# Patient Record
Sex: Male | Born: 1995 | Race: Black or African American | Hispanic: No | Marital: Single | Smoking: Former smoker
Health system: Southern US, Community
[De-identification: ages and names within clinical notes are randomized; demographics above are authoritative.]

## PROBLEM LIST (undated history)

## (undated) DIAGNOSIS — J45909 Unspecified asthma, uncomplicated: Secondary | ICD-10-CM

---
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
## 2016-07-20 ENCOUNTER — Emergency Department (HOSPITAL_COMMUNITY)
Admission: EM | Admit: 2016-07-20 | Discharge: 2016-07-20 | Disposition: A | Discharge status: Home / Self Care | Payer: No Typology Code available for payment source | Attending: Emergency Medicine | Admitting: Emergency Medicine

## 2016-07-20 ENCOUNTER — Encounter (HOSPITAL_COMMUNITY): Payer: Self-pay | Admitting: Emergency Medicine

## 2016-07-20 DIAGNOSIS — Z5321 Procedure and treatment not carried out due to patient leaving prior to being seen by health care provider: Secondary | ICD-10-CM | POA: Insufficient documentation

## 2016-07-20 DIAGNOSIS — Z4800 Encounter for change or removal of nonsurgical wound dressing: Secondary | ICD-10-CM | POA: Insufficient documentation

## 2016-07-20 NOTE — ED Triage Notes (Signed)
Pt states he cut his left middle finger at work 2 days ago and has been cleaning it with hydrogen peroxide and covering it with guaze then soaking the guaze with peroxide  Pt has white patches noted on his left middle finger and on his other fingers  Pt states the white patches started today

## 2016-07-20 NOTE — ED Notes (Signed)
Pt stated he thinks he over exaggerated and did not want to be seen by a doctor.  Left without being seen.

## 2020-02-28 ENCOUNTER — Emergency Department (HOSPITAL_COMMUNITY)
Admission: EM | Admit: 2020-02-28 | Discharge: 2020-02-28 | Payer: Medicaid Other | Attending: Emergency Medicine | Admitting: Emergency Medicine

## 2020-02-28 ENCOUNTER — Emergency Department (HOSPITAL_COMMUNITY): Payer: Medicaid Other

## 2020-02-28 ENCOUNTER — Other Ambulatory Visit: Payer: Self-pay

## 2020-02-28 ENCOUNTER — Encounter (HOSPITAL_COMMUNITY): Payer: Self-pay

## 2020-02-28 DIAGNOSIS — M79605 Pain in left leg: Secondary | ICD-10-CM

## 2020-02-28 DIAGNOSIS — Z23 Encounter for immunization: Secondary | ICD-10-CM | POA: Insufficient documentation

## 2020-02-28 DIAGNOSIS — Z87891 Personal history of nicotine dependence: Secondary | ICD-10-CM | POA: Insufficient documentation

## 2020-02-28 DIAGNOSIS — S80812A Abrasion, left lower leg, initial encounter: Secondary | ICD-10-CM

## 2020-02-28 DIAGNOSIS — J45909 Unspecified asthma, uncomplicated: Secondary | ICD-10-CM | POA: Insufficient documentation

## 2020-02-28 DIAGNOSIS — W1789XA Other fall from one level to another, initial encounter: Secondary | ICD-10-CM | POA: Insufficient documentation

## 2020-02-28 DIAGNOSIS — Y9302 Activity, running: Secondary | ICD-10-CM | POA: Insufficient documentation

## 2020-02-28 MED ORDER — IBUPROFEN 800 MG PO TABS
800.0000 mg | ORAL_TABLET | Freq: Once | ORAL | Status: AC
  Administered 2020-02-28: 800 mg via ORAL
  Filled 2020-02-28: qty 1

## 2020-02-28 MED ORDER — TETANUS-DIPHTH-ACELL PERTUSSIS 5-2.5-18.5 LF-MCG/0.5 IM SUSP
0.5000 mL | Freq: Once | INTRAMUSCULAR | Status: AC
  Administered 2020-02-28: 0.5 mL via INTRAMUSCULAR
  Filled 2020-02-28: qty 0.5

## 2020-02-28 NOTE — ED Triage Notes (Signed)
Patient arrived with [employer] due to complaints of left leg pain after falling in the woods.

## 2020-02-28 NOTE — ED Provider Notes (Signed)
[facility]-EMERGENCY DEPT Provider Note   CSN: [id number] Arrival date & time: 02/28/20  0057     History Chief Complaint  Patient presents with  . Leg Pain    Randy Flores is a 24 y.o. male.  The history is provided by the patient. No language interpreter was used.  Leg Pain Location:  Leg Time since incident:  2 hours Injury: yes   Mechanism of injury: fall   Fall:    Height of fall:  From standing while running   Impact surface:  Dirt   Entrapped after fall: no   Leg location:  L lower leg Pain details:    Radiates to:  Does not radiate   Severity:  Mild   Onset quality:  Sudden   Timing:  Constant   Progression:  Unchanged Chronicity:  New Dislocation: no   Tetanus status:  Unknown Prior injury to area:  No Relieved by:  Nothing Worsened by:  Bearing weight Ineffective treatments:  None tried Associated symptoms: no decreased ROM, no muscle weakness and no numbness        Past Medical History:  Diagnosis Date  . Asthma     There are no problems to display for this patient.   History reviewed. No pertinent surgical history.     Family History  Problem Relation Age of Onset  . Diabetes Mother     Social History   Tobacco Use  . Smoking status: Former Games developer  . Smokeless tobacco: Never Used  Substance Use Topics  . Alcohol use: No  . Drug use: No    Home Medications Prior to Admission medications   Not on File    Allergies    Ocuflox [ofloxacin]  Review of Systems   Review of Systems  Ten systems reviewed and are negative for acute change, except as noted in the HPI.    Physical Exam Updated Vital Signs BP 104/64 (BP Location: Left Arm)   Pulse 93   Temp 98.1 F (36.7 C) (Oral)   Resp 18   SpO2 99%   Physical Exam Vitals and nursing note reviewed.  Constitutional:      General: He is not in acute distress.    Appearance: He is well-developed. He is not diaphoretic.     Comments: Nontoxic  appearing and in NAD  HENT:     Head: Normocephalic and atraumatic.  Eyes:     General: No scleral icterus.    Conjunctiva/sclera: Conjunctivae normal.  Pulmonary:     Effort: Pulmonary effort is normal. No respiratory distress.     Comments: Respirations even and unlabored Musculoskeletal:        General: Tenderness (LLE and calf) present. Normal range of motion.     Cervical back: Normal range of motion.       Legs:     Comments: Abrasion to the left shin Compartments of the LLE are compressible. No deformity or crepitus.  Skin:    General: Skin is warm and dry.     Coloration: Skin is not pale.     Findings: No erythema or rash.  Neurological:     Mental Status: He is alert and oriented to person, place, and time.     Comments: Sensation to light touch intact in the LLE. Ambulatory with antalgic gait.  Psychiatric:        Behavior: Behavior normal.     ED Results / Procedures / Treatments   Labs (all labs ordered are listed, but  only abnormal results are displayed) Labs Reviewed - No data to display  EKG None  Radiology DG Tibia/Fibula Left  Result Date: 02/28/2020 CLINICAL DATA:  Fall left leg injury EXAM: LEFT TIBIA AND FIBULA - 2 VIEW COMPARISON:  None. FINDINGS: There is no evidence of fracture or other focal bone lesions. Soft tissues are unremarkable. IMPRESSION: Negative. Electronically Signed   By: Helyn Numbers MD   On: 02/28/2020 02:01    Procedures Procedures (including critical care time)  Medications Ordered in ED Medications  Tdap (BOOSTRIX) injection 0.5 mL (has no administration in time range)  ibuprofen (ADVIL) tablet 800 mg (has no administration in time range)    ED Course  I have reviewed the triage vital signs and the nursing notes.  Pertinent labs & imaging results that were available during my care of the patient were reviewed by me and considered in my medical decision making (see chart for details).    MDM Rules/Calculators/A&P                           Patient presents to the emergency department for evaluation of LLE pain. Patient neurovascularly intact on exam. Imaging negative for fracture, dislocation, bony deformity. Compartments in the affected extremity are soft. Plan for supportive management including RICE and NSAIDs; primary care follow up as needed. Return precautions discussed and provided. Patient discharged in stable condition in [employer] custody.   Final Clinical Impression(s) / ED Diagnoses Final diagnoses:  Left leg pain  Abrasion of anterior left lower leg, initial encounter    Rx / DC Orders ED Discharge Orders    None       Antony Madura, PA-C 02/28/20 0208    Nira Conn, MD 02/28/20 [phone]

## 2020-02-28 NOTE — Discharge Instructions (Signed)
Apply ice to areas of pain/injury.  Take 60 mg ibuprofen every 6 hours for management of pain/soreness.  You may return for new or concerning symptoms.

## 2022-02-17 IMAGING — CR DG TIBIA/FIBULA 2V*L*
4 series · 4 of 4 positions shown · non-contrast
Comparison: None.

CLINICAL DATA: Fall left leg injury

EXAM:
LEFT TIBIA AND FIBULA - 2 VIEW

[x tib-fib ap left]
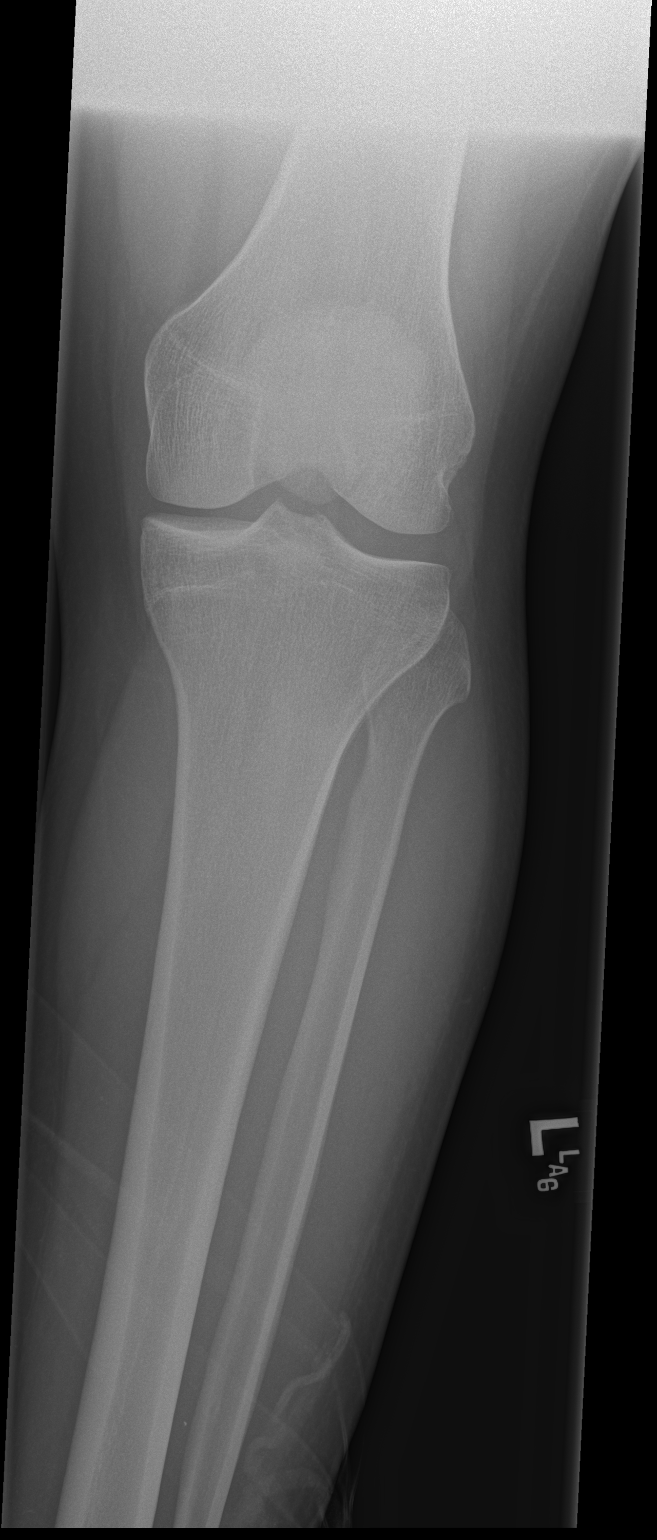

[x tib-fib lat left (1 of 3)]
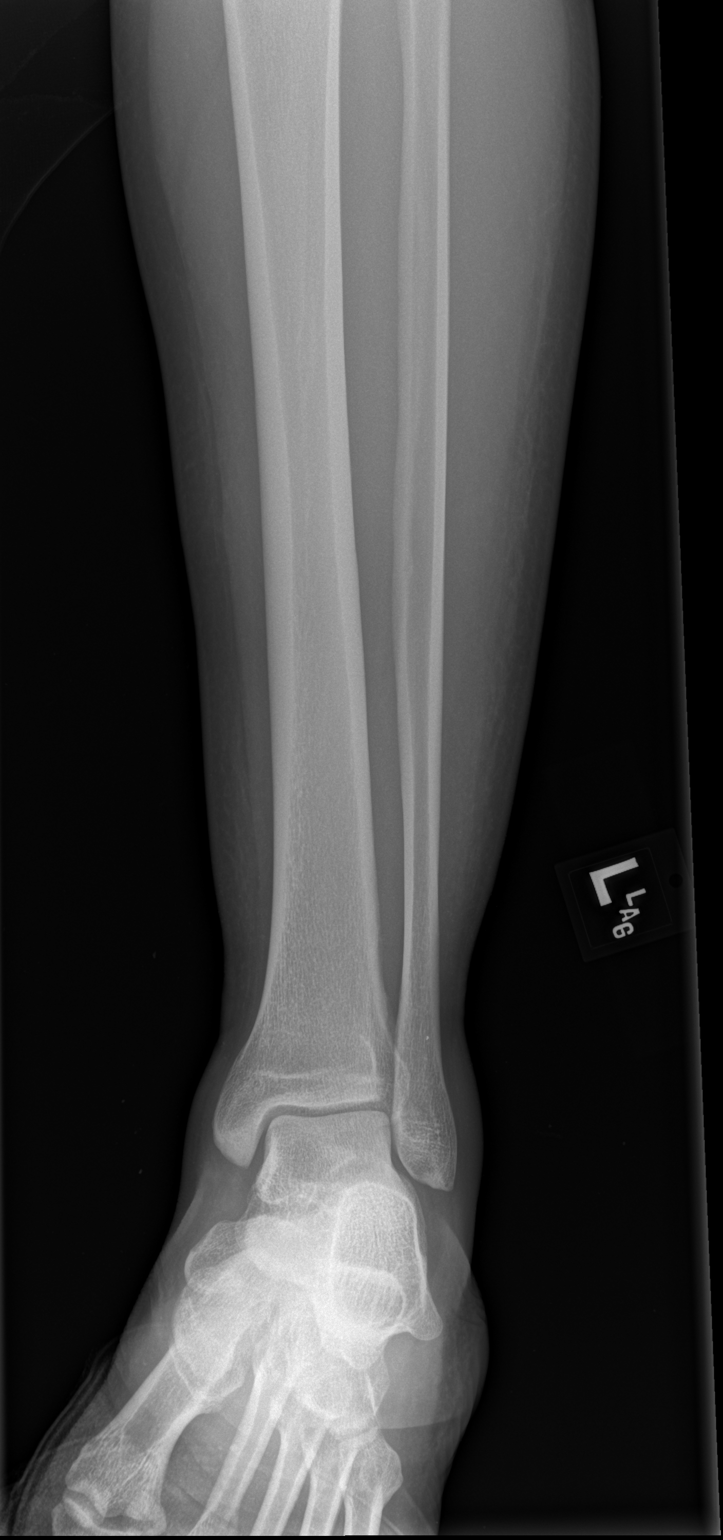

[x tib-fib lat left (2 of 3)]
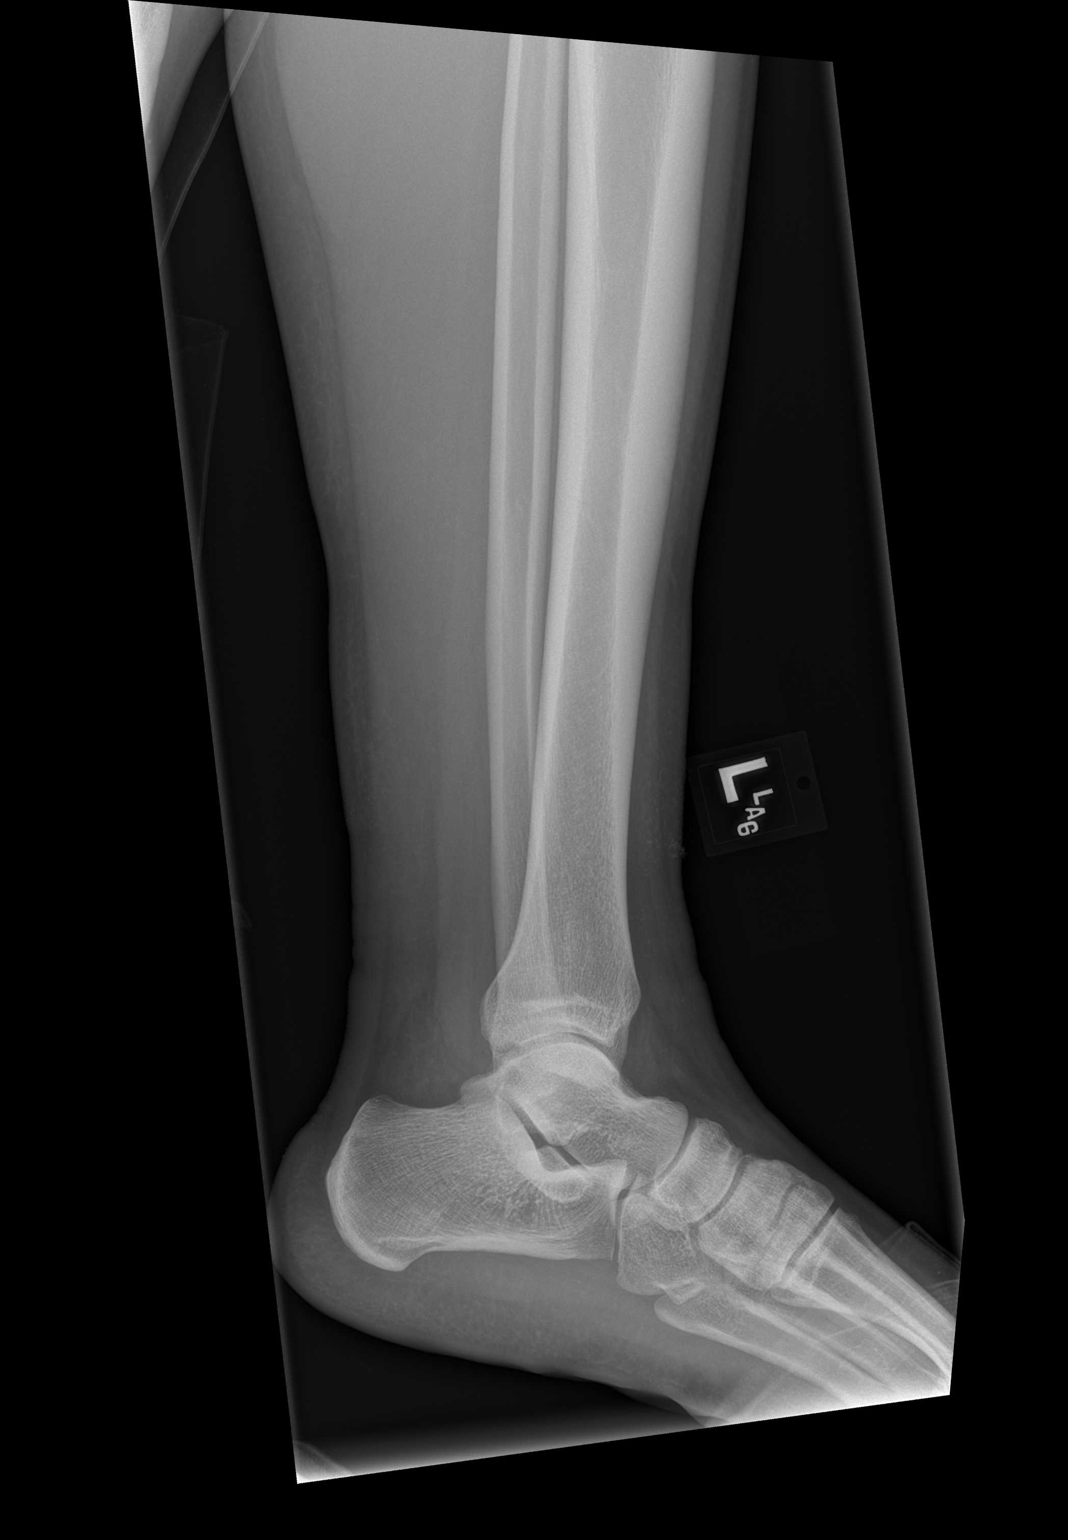

[x tib-fib lat left (3 of 3)]
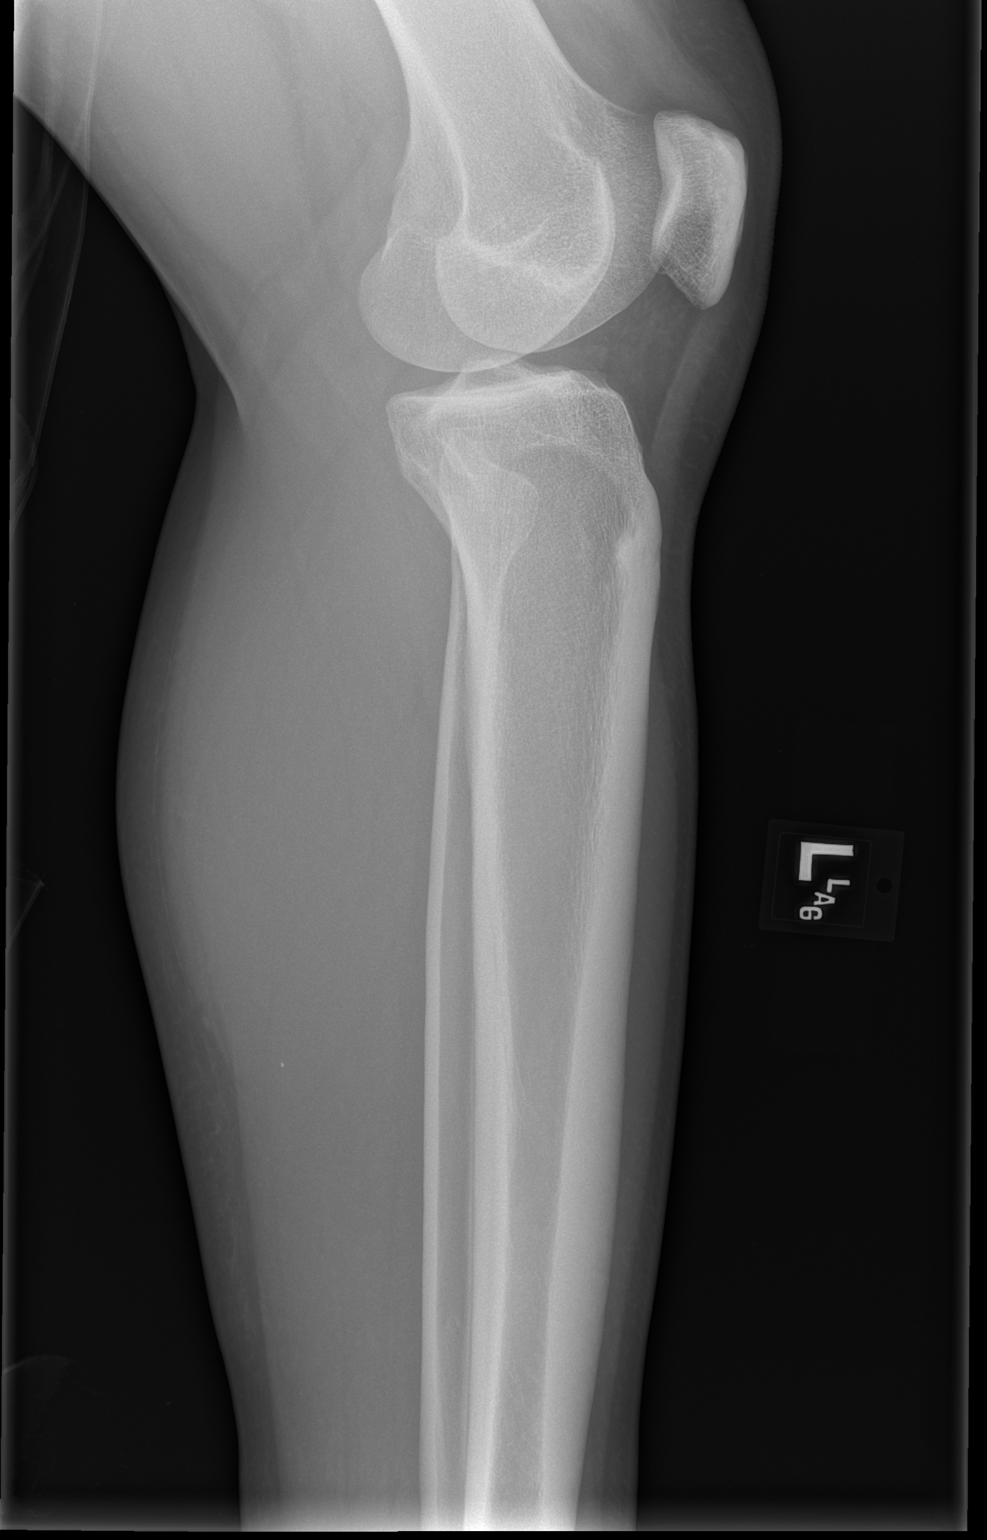

[4 of 4 positions shown; findings below may reference images not displayed]

FINDINGS: There is no evidence of fracture or other focal bone lesions. Soft
tissues are unremarkable.
IMPRESSION: Negative.

## 2024-03-11 ENCOUNTER — Emergency Department (HOSPITAL_COMMUNITY)
Admission: EM | Admit: 2024-03-11 | Discharge: 2024-03-11 | Disposition: A | Discharge status: Home / Self Care | Attending: Emergency Medicine | Admitting: Emergency Medicine

## 2024-03-11 ENCOUNTER — Emergency Department (HOSPITAL_COMMUNITY)

## 2024-03-11 ENCOUNTER — Other Ambulatory Visit: Payer: Self-pay

## 2024-03-11 DIAGNOSIS — M25521 Pain in right elbow: Secondary | ICD-10-CM | POA: Diagnosis not present

## 2024-03-11 DIAGNOSIS — S3011XA Contusion of abdominal wall, initial encounter: Secondary | ICD-10-CM | POA: Insufficient documentation

## 2024-03-11 DIAGNOSIS — R079 Chest pain, unspecified: Secondary | ICD-10-CM | POA: Diagnosis not present

## 2024-03-11 DIAGNOSIS — R519 Headache, unspecified: Secondary | ICD-10-CM | POA: Diagnosis not present

## 2024-03-11 DIAGNOSIS — M546 Pain in thoracic spine: Secondary | ICD-10-CM | POA: Diagnosis not present

## 2024-03-11 DIAGNOSIS — Z23 Encounter for immunization: Secondary | ICD-10-CM | POA: Insufficient documentation

## 2024-03-11 DIAGNOSIS — S46911A Strain of unspecified muscle, fascia and tendon at shoulder and upper arm level, right arm, initial encounter: Secondary | ICD-10-CM | POA: Diagnosis not present

## 2024-03-11 DIAGNOSIS — F419 Anxiety disorder, unspecified: Secondary | ICD-10-CM | POA: Insufficient documentation

## 2024-03-11 DIAGNOSIS — M25551 Pain in right hip: Secondary | ICD-10-CM | POA: Diagnosis not present

## 2024-03-11 DIAGNOSIS — Y92410 Unspecified street and highway as the place of occurrence of the external cause: Secondary | ICD-10-CM | POA: Diagnosis not present

## 2024-03-11 DIAGNOSIS — S161XXA Strain of muscle, fascia and tendon at neck level, initial encounter: Secondary | ICD-10-CM | POA: Diagnosis not present

## 2024-03-11 DIAGNOSIS — M79651 Pain in right thigh: Secondary | ICD-10-CM | POA: Insufficient documentation

## 2024-03-11 DIAGNOSIS — M542 Cervicalgia: Secondary | ICD-10-CM | POA: Diagnosis present

## 2024-03-11 LAB — URINALYSIS, ROUTINE W REFLEX MICROSCOPIC
Bilirubin Urine: NEGATIVE
Glucose, UA: NEGATIVE mg/dL
Hgb urine dipstick: NEGATIVE
Ketones, ur: 5 mg/dL — AB
Leukocytes,Ua: NEGATIVE
Nitrite: NEGATIVE
Protein, ur: NEGATIVE mg/dL
Specific Gravity, Urine: 1.045 — ABNORMAL HIGH (ref 1.005–1.030)
pH: 6 (ref 5.0–8.0)

## 2024-03-11 LAB — CBC WITH DIFFERENTIAL/PLATELET
Abs Immature Granulocytes: 0.01 K/uL (ref 0.00–0.07)
Basophils Absolute: 0 K/uL (ref 0.0–0.1)
Basophils Relative: 0 %
Eosinophils Absolute: 0.1 K/uL (ref 0.0–0.5)
Eosinophils Relative: 1 %
HCT: 46.7 % (ref 39.0–52.0)
Hemoglobin: 15.1 g/dL (ref 13.0–17.0)
Immature Granulocytes: 0 %
Lymphocytes Relative: 26 %
Lymphs Abs: 1.6 K/uL (ref 0.7–4.0)
MCH: 28.9 pg (ref 26.0–34.0)
MCHC: 32.3 g/dL (ref 30.0–36.0)
MCV: 89.3 fL (ref 80.0–100.0)
Monocytes Absolute: 0.4 K/uL (ref 0.1–1.0)
Monocytes Relative: 6 %
Neutro Abs: 4.1 K/uL (ref 1.7–7.7)
Neutrophils Relative %: 67 %
Platelets: 142 K/uL — ABNORMAL LOW (ref 150–400)
RBC: 5.23 MIL/uL (ref 4.22–5.81)
RDW: 14.2 % (ref 11.5–15.5)
WBC: 6.2 K/uL (ref 4.0–10.5)
nRBC: 0 % (ref 0.0–0.2)

## 2024-03-11 LAB — I-STAT CHEM 8, ED
BUN: 13 mg/dL (ref 6–20)
Calcium, Ion: 1.09 mmol/L — ABNORMAL LOW (ref 1.15–1.40)
Chloride: 106 mmol/L (ref 98–111)
Creatinine, Ser: 1 mg/dL (ref 0.61–1.24)
Glucose, Bld: 96 mg/dL (ref 70–99)
HCT: 47 % (ref 39.0–52.0)
Hemoglobin: 16 g/dL (ref 13.0–17.0)
Potassium: 4.1 mmol/L (ref 3.5–5.1)
Sodium: 141 mmol/L (ref 135–145)
TCO2: 24 mmol/L (ref 22–32)

## 2024-03-11 LAB — COMPREHENSIVE METABOLIC PANEL WITH GFR
ALT: 35 U/L (ref 0–44)
AST: 36 U/L (ref 15–41)
Albumin: 4.2 g/dL (ref 3.5–5.0)
Alkaline Phosphatase: 44 U/L (ref 38–126)
Anion gap: 9 (ref 5–15)
BUN: 11 mg/dL (ref 6–20)
CO2: 23 mmol/L (ref 22–32)
Calcium: 9.1 mg/dL (ref 8.9–10.3)
Chloride: 105 mmol/L (ref 98–111)
Creatinine, Ser: 1.05 mg/dL (ref 0.61–1.24)
GFR, Estimated: >60 mL/min (ref >60)
Glucose, Bld: 98 mg/dL (ref 70–99)
Potassium: 4.1 mmol/L (ref 3.5–5.1)
Sodium: 137 mmol/L (ref 135–145)
Total Bilirubin: 0.4 mg/dL (ref 0.0–1.2)
Total Protein: 7.3 g/dL (ref 6.5–8.1)

## 2024-03-11 LAB — SAMPLE TO BLOOD BANK

## 2024-03-11 LAB — I-STAT CG4 LACTIC ACID, ED: Lactic Acid, Venous: 0.8 mmol/L (ref 0.5–1.9)

## 2024-03-11 MED ORDER — SODIUM CHLORIDE 0.9 % IV SOLN: 250.0000 mL | INTRAVENOUS | Status: DC | PRN

## 2024-03-11 MED ORDER — MORPHINE SULFATE (PF) 4 MG/ML IV SOLN
4.0000 mg | Freq: Once | INTRAVENOUS | Status: AC
  Administered 2024-03-11: 4 mg via INTRAVENOUS
  Filled 2024-03-11: qty 1

## 2024-03-11 MED ORDER — SODIUM CHLORIDE 0.9% FLUSH
3.0000 mL | Freq: Two times a day (BID) | INTRAVENOUS | Status: DC
  Administered 2024-03-11: 3 mL via INTRAVENOUS

## 2024-03-11 MED ORDER — MORPHINE SULFATE (PF) 2 MG/ML IV SOLN
4.0000 mg | Freq: Once | INTRAVENOUS | Status: AC
  Administered 2024-03-11: 4 mg via INTRAVENOUS
  Filled 2024-03-11: qty 2

## 2024-03-11 MED ORDER — SODIUM CHLORIDE 0.9% FLUSH: 3.0000 mL | INTRAVENOUS | Status: DC | PRN

## 2024-03-11 MED ORDER — TETANUS-DIPHTH-ACELL PERTUSSIS 5-2-15.5 LF-MCG/0.5 IM SUSP
0.5000 mL | Freq: Once | INTRAMUSCULAR | Status: AC
  Administered 2024-03-11: 0.5 mL via INTRAMUSCULAR
  Filled 2024-03-11: qty 0.5

## 2024-03-11 MED ORDER — IOHEXOL 350 MG/ML SOLN
75.0000 mL | Freq: Once | INTRAVENOUS | Status: AC | PRN
  Administered 2024-03-11: 75 mL via INTRAVENOUS

## 2024-03-11 MED ORDER — IBUPROFEN 400 MG PO TABS
600.0000 mg | ORAL_TABLET | Freq: Once | ORAL | Status: AC
  Administered 2024-03-11: 600 mg via ORAL
  Filled 2024-03-11: qty 1

## 2024-03-11 NOTE — ED Notes (Signed)
 Patient in EMS C-collar
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
## 2024-03-11 NOTE — ED Notes (Signed)
 ..  The patient is A&OX4, ambulatory at d/c with independent steady gait, NAD. Pt verbalized understanding of d/c instructions and follow up care.
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
## 2024-03-11 NOTE — ED Notes (Signed)
 CCMD called by this RN

## 2024-03-11 NOTE — Discharge Instructions (Addendum)
 Use Tylenol every 4 hours, ibuprofen  every 6 hours and ice regularly for pain.  Work note provided rest tomorrow.  Return for new or worsening signs or symptoms.  You will be sore the next 2 to 3 days.
